# Patient Record
Sex: Male | Born: 1990 | Race: Black or African American | Hispanic: No | Marital: Single | Smoking: Current every day smoker
Health system: Southern US, Community
[De-identification: ages and names within clinical notes are randomized; demographics above are authoritative.]

## PROBLEM LIST (undated history)

## (undated) DIAGNOSIS — K859 Acute pancreatitis without necrosis or infection, unspecified: Secondary | ICD-10-CM

---
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
## 2010-04-28 LAB — COMPREHENSIVE METABOLIC PANEL
ALT: 18 U/L (ref 0–53)
AST: 19 U/L (ref 0–37)
Albumin: 4 g/dL (ref 3.5–5.2)
Alkaline Phosphatase: 97 U/L (ref 39–117)
BUN: 11 mg/dL (ref 6–23)
CO2: 29 mEq/L (ref 19–32)
Calcium: 9.3 mg/dL (ref 8.4–10.5)
Chloride: 107 mEq/L (ref 96–112)
Creatinine, Ser: 1.2 mg/dL (ref 0.4–1.5)
GFR calc Af Amer: >60 mL/min (ref >60)
GFR calc non Af Amer: >60 mL/min (ref >60)
Glucose, Bld: 91 mg/dL (ref 70–99)
Potassium: 3.7 mEq/L (ref 3.5–5.1)
Sodium: 145 mEq/L (ref 135–145)
Total Bilirubin: 0.5 mg/dL (ref 0.3–1.2)
Total Protein: 7.1 g/dL (ref 6.0–8.3)

## 2010-04-28 LAB — CBC
HCT: 34.9 % — ABNORMAL LOW (ref 39.0–52.0)
Hemoglobin: 12 g/dL — ABNORMAL LOW (ref 13.0–17.0)
MCHC: 34.4 g/dL (ref 30.0–36.0)
MCV: 81.9 fL (ref 78.0–100.0)
Platelets: 205 10*3/uL (ref 150–400)
RBC: 4.27 MIL/uL (ref 4.22–5.81)
RDW: 13 % (ref 11.5–15.5)
WBC: 6.8 10*3/uL (ref 4.0–10.5)

## 2010-04-28 LAB — URINALYSIS, ROUTINE W REFLEX MICROSCOPIC
Bilirubin Urine: NEGATIVE
Glucose, UA: NEGATIVE mg/dL
Hgb urine dipstick: NEGATIVE
Ketones, ur: NEGATIVE mg/dL
Nitrite: NEGATIVE
Protein, ur: NEGATIVE mg/dL
Specific Gravity, Urine: 1.031 — ABNORMAL HIGH (ref 1.005–1.030)
Urobilinogen, UA: 1 mg/dL (ref 0.0–1.0)
pH: 6 (ref 5.0–8.0)

## 2010-04-28 LAB — DIFFERENTIAL
Basophils Absolute: 0.1 10*3/uL (ref 0.0–0.1)
Basophils Relative: 2 % — ABNORMAL HIGH (ref 0–1)
Eosinophils Absolute: 0.1 10*3/uL (ref 0.0–0.7)
Monocytes Absolute: 0.4 10*3/uL (ref 0.1–1.0)
Neutro Abs: 4.4 10*3/uL (ref 1.7–7.7)
Neutrophils Relative %: 64 % (ref 43–77)

## 2010-04-28 LAB — AMYLASE: Amylase: 125 U/L — ABNORMAL HIGH (ref 0–105)

## 2010-05-22 LAB — BASIC METABOLIC PANEL
BUN: 10 mg/dL (ref 6–23)
CO2: 27 mEq/L (ref 19–32)
Calcium: 9.9 mg/dL (ref 8.4–10.5)
Creatinine, Ser: 1 mg/dL (ref 0.4–1.5)

## 2010-05-22 LAB — CBC
MCHC: 32.8 g/dL (ref 31.0–37.0)
MCV: 82.9 fL (ref 78.0–98.0)
Platelets: 199 10*3/uL (ref 150–400)
RBC: 4.86 MIL/uL (ref 3.80–5.70)

## 2010-05-22 LAB — DIFFERENTIAL
Basophils Absolute: 0.2 10*3/uL — ABNORMAL HIGH (ref 0.0–0.1)
Basophils Relative: 2 % — ABNORMAL HIGH (ref 0–1)
Eosinophils Absolute: 0.1 10*3/uL (ref 0.0–1.2)
Monocytes Relative: 6 % (ref 3–11)
Neutro Abs: 9.3 10*3/uL — ABNORMAL HIGH (ref 1.7–8.0)
Neutrophils Relative %: 85 % — ABNORMAL HIGH (ref 43–71)

## 2012-01-09 IMAGING — US US ABDOMEN COMPLETE
1 series · 14 of 25 positions shown · non-contrast
Comparison: None.

CLINICAL DATA: Right upper quadrant pain

COMPLETE ABDOMINAL ULTRASOUND

[Series 1: us abdomen complete · 0.26mm/px · 14 of 67 slices shown]
[im 1/67]
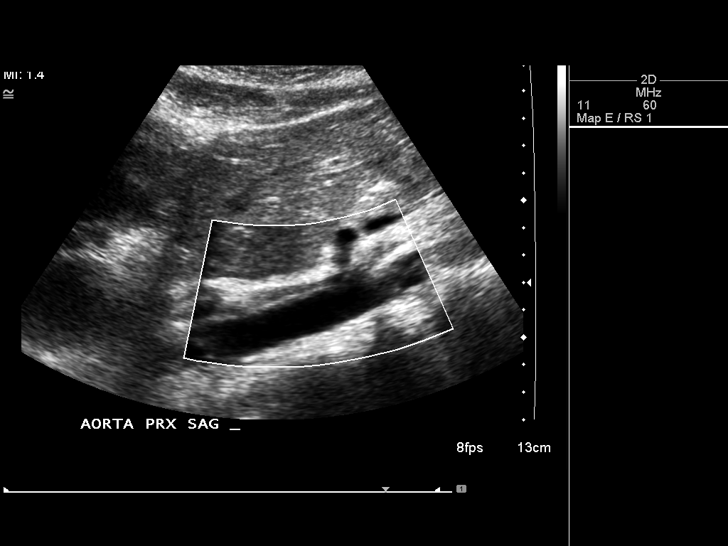
[im 6/67]
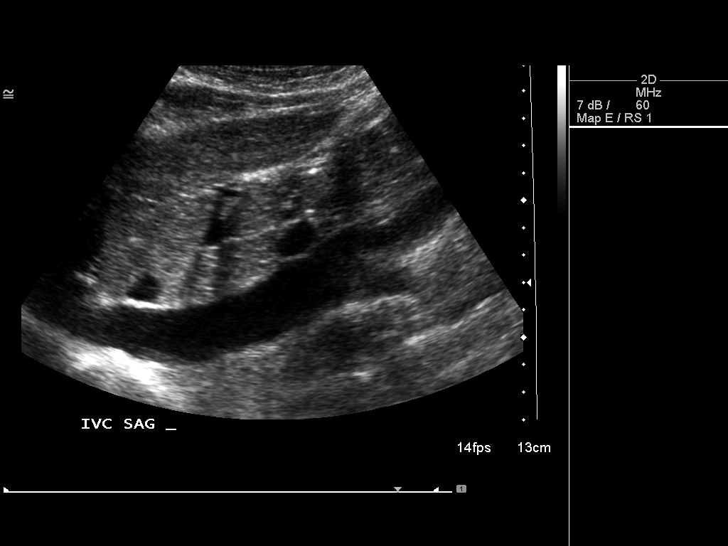
[im 12/67]
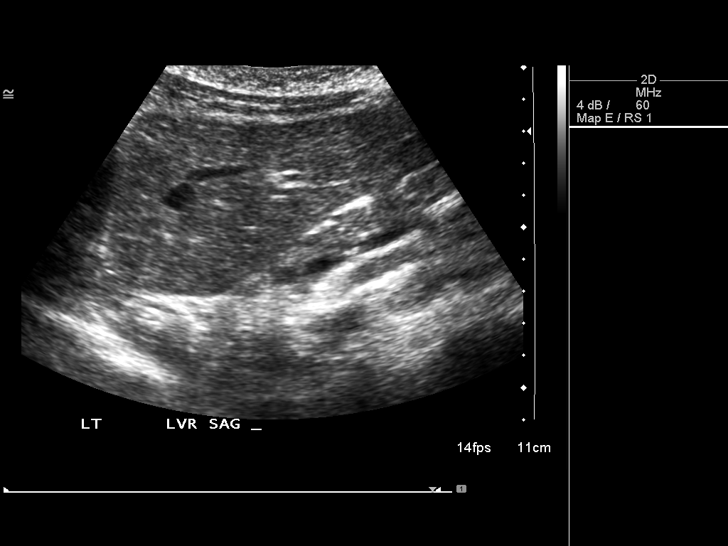
[im 17/67]
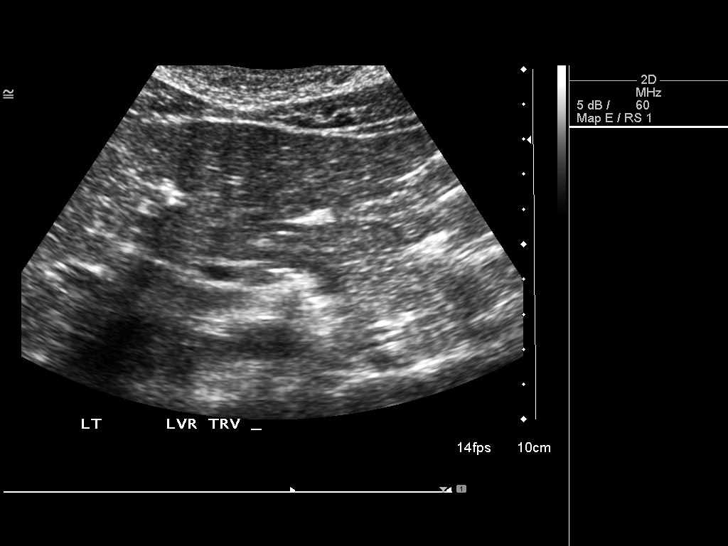
[im 23/67]
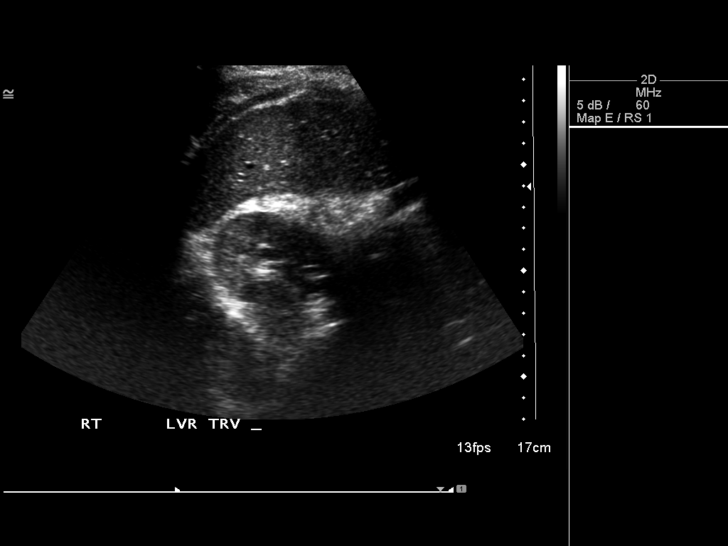
[im 25/67]
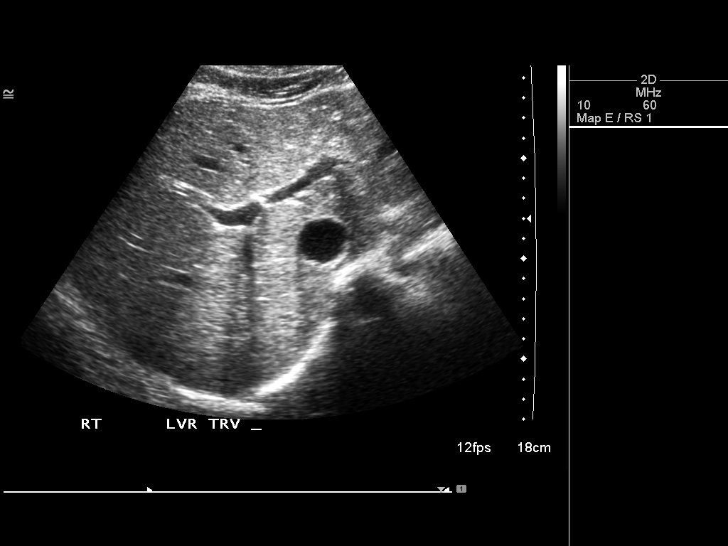
[im 31/67]
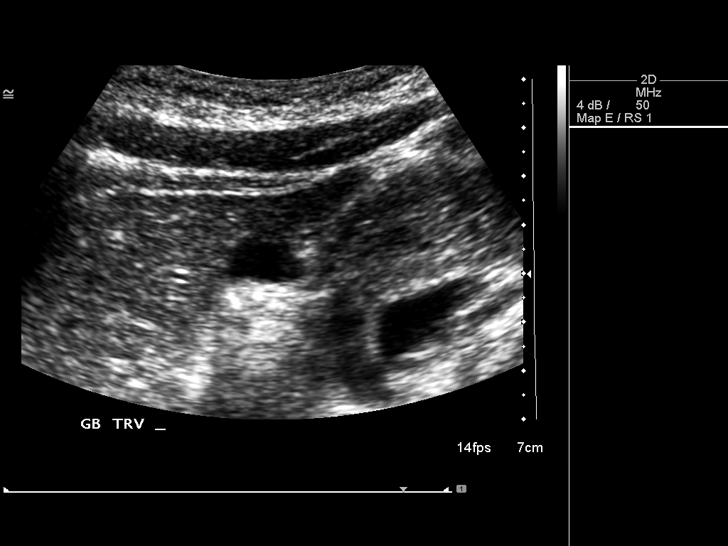
[im 36/67]
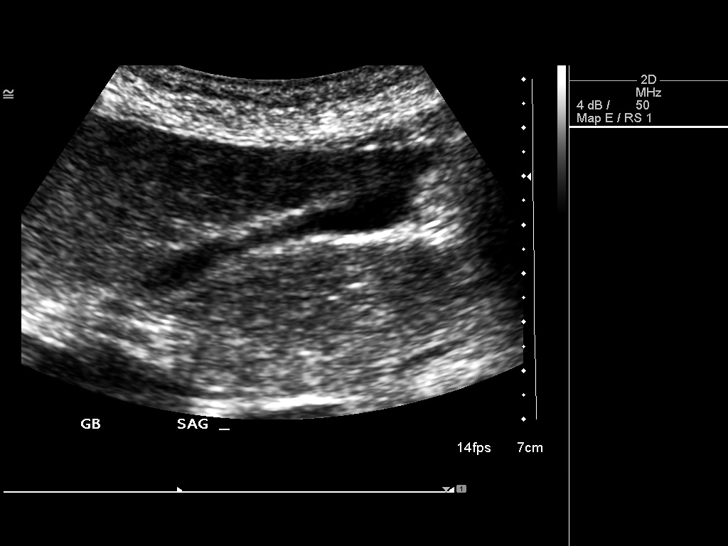
[im 42/67]
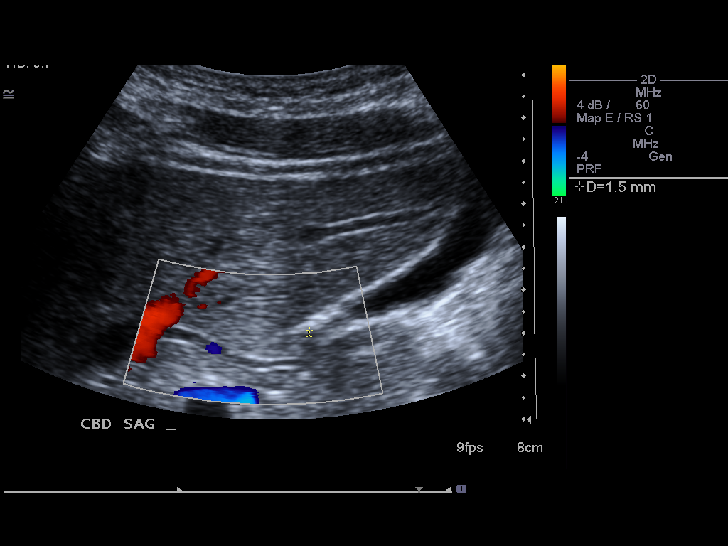
[im 45/67]
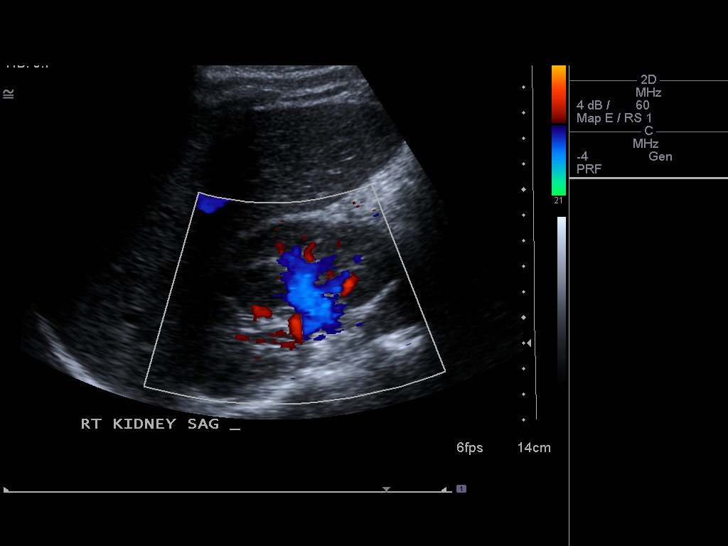
[im 50/67]
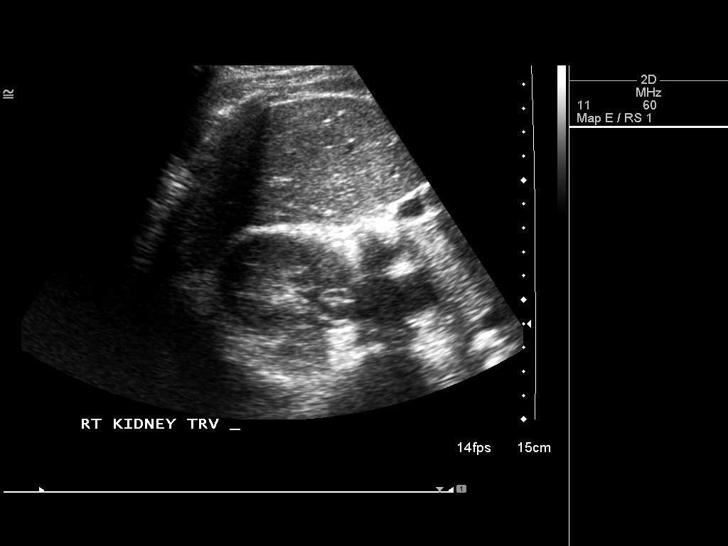
[im 56/67]
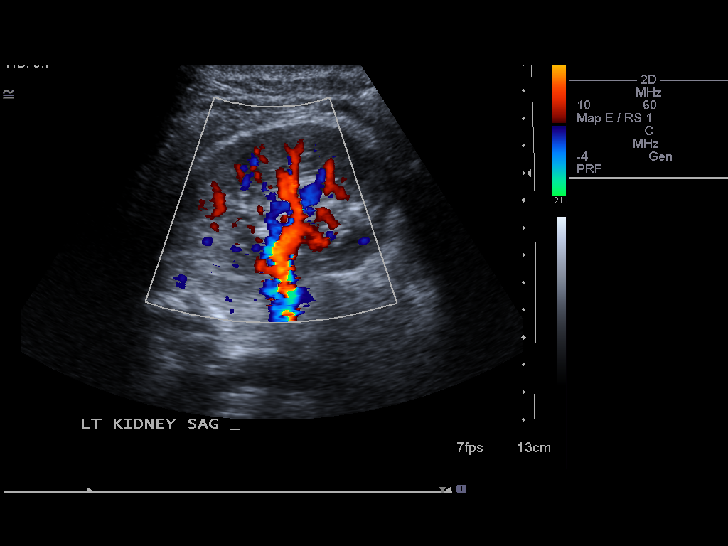
[im 61/67]
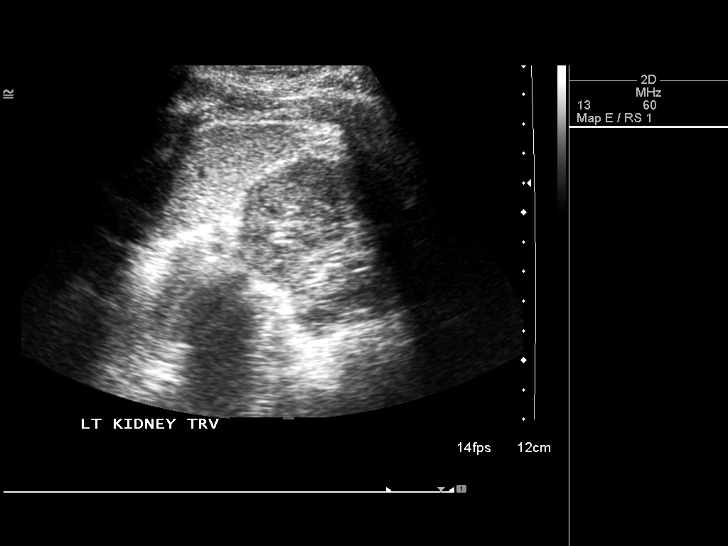
[im 67/67]
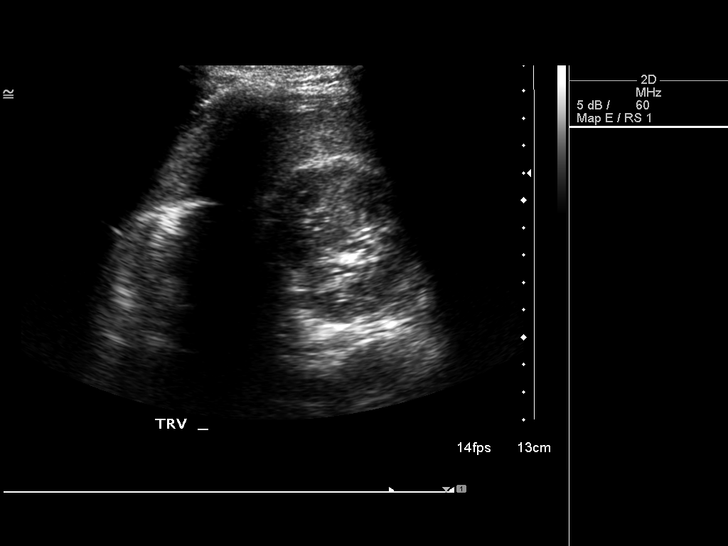

[14 of 25 positions shown; findings below may reference images not displayed]

FINDINGS: Gallbladder:  The gallbladder is contracted.  The patient
apparently ate a meal prior to the ultrasound exam.  There are no
definite stones.  No wall thickening or edema.  However,
considering the symptoms, the patient should be restudied after an
overnight fast.  The follow-up exam can be limited to the
gallbladder.

Common bile duct:  1.5 mm

Liver:  Normal

IVC:  Normal

Pancreas:  Normal

Spleen:  Normal

Right Kidney:  Normal.  11 cm in length.

Left Kidney:  Normal.  10.1 cm in length.

Abdominal aorta:  Normal
IMPRESSION: 1.  The gallbladder is contracted, probably related to oral intake
by the patient prior to the study.  Ideally, ideally, a limited
study of the gallbladder should be done after an overnight fast to
clear the gallbladder.
2.  No other acute or significant findings.

## 2012-04-03 ENCOUNTER — Encounter (HOSPITAL_BASED_OUTPATIENT_CLINIC_OR_DEPARTMENT_OTHER): Payer: Self-pay | Admitting: *Deleted

## 2012-04-03 ENCOUNTER — Emergency Department (HOSPITAL_BASED_OUTPATIENT_CLINIC_OR_DEPARTMENT_OTHER)
Admission: EM | Admit: 2012-04-03 | Discharge: 2012-04-03 | Disposition: A | Discharge status: Home / Self Care | Payer: BC Managed Care – PPO | Attending: Emergency Medicine | Admitting: Emergency Medicine

## 2012-04-03 ENCOUNTER — Emergency Department (HOSPITAL_BASED_OUTPATIENT_CLINIC_OR_DEPARTMENT_OTHER): Payer: BC Managed Care – PPO

## 2012-04-03 DIAGNOSIS — N503 Cyst of epididymis: Secondary | ICD-10-CM

## 2012-04-03 DIAGNOSIS — N5089 Other specified disorders of the male genital organs: Secondary | ICD-10-CM | POA: Insufficient documentation

## 2012-04-03 DIAGNOSIS — N453 Epididymo-orchitis: Secondary | ICD-10-CM | POA: Insufficient documentation

## 2012-04-03 MED ORDER — OXYCODONE-ACETAMINOPHEN 5-325 MG PO TABS
2.0000 | ORAL_TABLET | Freq: Once | ORAL | Status: AC
  Administered 2012-04-03: 2 via ORAL
  Filled 2012-04-03 (×2): qty 2

## 2012-04-03 MED ORDER — OXYCODONE-ACETAMINOPHEN 5-325 MG PO TABS: 2.0000 | ORAL_TABLET | ORAL | Status: DC | PRN

## 2012-04-03 MED ORDER — IBUPROFEN 800 MG PO TABS: 800.0000 mg | ORAL_TABLET | Freq: Three times a day (TID) | ORAL | Status: DC

## 2012-04-03 NOTE — ED Notes (Addendum)
Pt states he has had a boil? In his "private area" x 1 year. "Way worse now" Saw Dr. on Thursday and was placed on antibiotic.

## 2012-04-03 NOTE — ED Provider Notes (Signed)
History     CSN: [id number]  Arrival date & time 04/03/12  1748   First MD Initiated Contact with Patient 04/03/12 1815      Chief Complaint  Patient presents with  . Abscess    (Consider location/radiation/quality/duration/timing/severity/associated sxs/prior treatment) HPI Comments: Patient is a 22 year old male who presents with a 1 year history of scrotal swelling that became acutely worse 5 days ago. Patient reports gradual onset and progressive worsening of symptoms. Patient reports an area of tenderness associated with the swelling. Palpation makes the pain worse. Nothing makes the pain better. Patient saw his PCP 3 days ago who prescribed Augmentin. Patient reports increased swelling and pain since the Augmentin was started. No fever, abdominal pain, NVD, or other associated symptoms.    History reviewed. No pertinent past medical history.  History reviewed. No pertinent past surgical history.  History reviewed. No pertinent family history.  History  Substance Use Topics  . Smoking status: Never Smoker   . Smokeless tobacco: Not on file  . Alcohol Use: No      Review of Systems  Genitourinary: Positive for scrotal swelling.  All other systems reviewed and are negative.    Allergies  Review of patient's allergies indicates no known allergies.  Home Medications  No current outpatient prescriptions on file.  BP 134/75  Pulse 94  Temp(Src) 98.3 F (36.8 C) (Oral)  Resp 15  Ht 6\' 1"  (1.854 m)  Wt 190 lb (86.183 kg)  BMI 25.07 kg/m2  SpO2 100%  Physical Exam  Nursing note and vitals reviewed. Constitutional: He is oriented to person, place, and time. He appears well-developed and well-nourished. No distress.  HENT:  Head: Normocephalic and atraumatic.  Eyes: Conjunctivae and EOM are normal.  Neck: Normal range of motion.  Cardiovascular: Normal rate and regular rhythm.  Exam reveals no gallop and no friction rub.   No murmur heard. Pulmonary/Chest:  Effort normal and breath sounds normal. He has no wheezes. He has no rales. He exhibits no tenderness.  Abdominal: Soft. He exhibits no distension. There is no tenderness. There is no rebound.  Genitourinary: Penis normal.  Posterior scrotal tenderness to palpation with a central area of tenderness with noted erythema and edema. Palpable mass extending from the central scrotum up between testicles.   Musculoskeletal: Normal range of motion.  Neurological: He is alert and oriented to person, place, and time. Coordination normal.  Speech is goal-oriented. Moves limbs without ataxia.   Skin: Skin is warm and dry.  Psychiatric: He has a normal mood and affect. His behavior is normal.    ED Course  Procedures (including critical care time)  Labs Reviewed - No data to display US Scrotum  04/03/2012  *RADIOLOGY REPORT*  Clinical Data:  Pain and swelling.  Infection.  SCROTAL ULTRASOUND DOPPLER ULTRASOUND OF THE TESTICLES  Technique: Complete ultrasound examination of the testicles, epididymis, and other scrotal structures was performed.  Color and spectral Doppler ultrasound were also utilized to evaluate blood flow to the testicles.  Comparison:  None  Findings:  Right testis:  Normal measuring 5.2 x 2.9 x 4.9 cm.  Normal echogenicity.  No focal lesions.  Normal color flow pattern. Normal arterial and venous wave forms.  Left testis:  Normal measuring 5.3 cm x 2.5 cm x 4.0 cm.  Similarly normal Doppler wave forms and color flow pattern.  Right epididymis:  5 mm epididymal cyst.  Borderline increased color flow pattern.  Left epididymis:  3 mm cyst.  Borderline  increased color flow pattern.  Hydrocele:  Small hydrocele on the left.  Varicocele:  None  Pulsed Doppler interrogation of both testes demonstrates low resistance flow bilaterally.  At the area of pain, there is a 2.7 x 2.2 x 2.5 cm area of heterogeneous echogenicity with increased vascularity that looks like an area of phlegmonous inflammation or  early abscess.  IMPRESSION: Normal appearance of the testicles themselves.  Question slight hyperemia of the epididymi which could go along with low-level epididymitis.   Small hydrocele on the left.  In the area of concern of the scrotal pain, there is a heterogeneous 2.7 cm collection consistent with an area of phlegmonous inflammation or early abscess formation.   Original Report Authenticated By: Paulina Fusi, M.D.      1. Epididymal cyst   2. Epididymitis       MDM  6:49 PM Patient will have US scrotum. Patient declines pain medication. Patient afebrile with stable vitals.   8:16 PM US shows right and left epididymal cysts with another area of inflammation likely infectious and possible early abscess. Patient will have Percocet for pain and Urology follow up. Patient instructed to continue to take Augmentin until gone. Patient afebrile and non toxic appearing. Vitals stable for discharge.     Emilia Beck, [place] 04/03/12 2039

## 2012-04-03 NOTE — ED Notes (Signed)
ED PAC at bedside. 

## 2012-04-03 NOTE — ED Provider Notes (Signed)
Medical screening examination/treatment/procedure(s) were conducted as a shared visit with non-physician practitioner(s) and myself.  I personally evaluated the patient during the encounter  Derwood Kaplan, MD 04/03/12 2353

## 2012-05-23 ENCOUNTER — Encounter (HOSPITAL_COMMUNITY): Payer: Self-pay | Admitting: *Deleted

## 2012-05-23 ENCOUNTER — Emergency Department (HOSPITAL_COMMUNITY)
Admission: EM | Admit: 2012-05-23 | Discharge: 2012-05-23 | Disposition: A | Discharge status: Home / Self Care | Payer: BC Managed Care – PPO | Attending: Emergency Medicine | Admitting: Emergency Medicine

## 2012-05-23 DIAGNOSIS — K649 Unspecified hemorrhoids: Secondary | ICD-10-CM

## 2012-05-23 DIAGNOSIS — N390 Urinary tract infection, site not specified: Secondary | ICD-10-CM | POA: Insufficient documentation

## 2012-05-23 DIAGNOSIS — K644 Residual hemorrhoidal skin tags: Secondary | ICD-10-CM | POA: Insufficient documentation

## 2012-05-23 LAB — URINALYSIS, ROUTINE W REFLEX MICROSCOPIC
Bilirubin Urine: NEGATIVE
Ketones, ur: NEGATIVE mg/dL
Leukocytes, UA: NEGATIVE
Nitrite: NEGATIVE
Protein, ur: NEGATIVE mg/dL
Urobilinogen, UA: 1 mg/dL (ref 0.0–1.0)

## 2012-05-23 MED ORDER — HYDROCORTISONE 2.5 % RE CREA: TOPICAL_CREAM | Freq: Two times a day (BID) | RECTAL | Status: DC

## 2012-05-23 NOTE — ED Provider Notes (Signed)
History     CSN: [id number]  Arrival date & time 05/23/12  1029   First MD Initiated Contact with Patient 05/23/12 1037      Chief Complaint  Patient presents with  . Abscess    anal  . Urinary Tract Infection    (Consider location/radiation/quality/duration/timing/severity/associated sxs/prior treatment) HPI Comments: Pt states that he has a bump in his bottom and he feels pressure:pt states that he has burning with urination:pt states that he is sexually active and doesn't use condom and pt denies rectal sex:pt states that he has had more that one sexual contact  Patient is a 22 y.o. male presenting with abscess. The history is provided by the patient. No language interpreter was used.  Abscess Location:  Ano-genital Ano-genital abscess location:  Anus Abscess quality: painful   Abscess quality: not draining and no warmth   Duration:  3 days Progression:  Unchanged Pain details:    Quality:  Throbbing Context: not diabetes, not immunosuppression and not injected drug use   Relieved by:  Nothing Worsened by:  Nothing tried Ineffective treatments:  None tried Risk factors: prior abscess     History reviewed. No pertinent past medical history.  History reviewed. No pertinent past surgical history.  No family history on file.  History  Substance Use Topics  . Smoking status: Never Smoker   . Smokeless tobacco: Never Used  . Alcohol Use: No      Review of Systems  Constitutional: Negative.   Respiratory: Negative.   Cardiovascular: Negative.     Allergies  Fruit & vegetable daily  Home Medications   Current Outpatient Rx  Name  Route  Sig  Dispense  Refill  . amoxicillin-clavulanate (AUGMENTIN) 875-125 MG per tablet   Oral   Take 1 tablet by mouth 2 (two) times daily.           BP 158/72  Pulse 103  Temp(Src) 97.9 F (36.6 C) (Oral)  Resp 20  SpO2 100%  Physical Exam  Nursing note and vitals reviewed. Constitutional: He is oriented to  person, place, and time. He appears well-developed and well-nourished.  Cardiovascular: Normal rate and regular rhythm.   Pulmonary/Chest: Effort normal and breath sounds normal.  Abdominal: Soft. Bowel sounds are normal. There is no tenderness.  Genitourinary:  No penile discharge noted:pt has external hemorrhoid noted:no warmth or redness:no internal fluctuance or tenderness noted   Musculoskeletal: Normal range of motion.  Neurological: He is oriented to person, place, and time.  Skin: Skin is warm and dry.    ED Course  Procedures (including critical care time)  Labs Reviewed  GC/CHLAMYDIA PROBE AMP  URINALYSIS, ROUTINE W REFLEX MICROSCOPIC   No results found.   1. Hemorrhoid       MDM  No infection noted in urine:rectal exam consistent with hemorrhoid not abcess:will treat:pt educated on using condoms       Teressa Lower, NP 05/23/12 1201

## 2012-05-23 NOTE — ED Notes (Signed)
Pt states a few days ago developed a anal abscess, also having L flank pain and burning w/ urination. Pt denies n/v/d.

## 2012-05-24 LAB — GC/CHLAMYDIA PROBE AMP
CT Probe RNA: NEGATIVE
GC Probe RNA: NEGATIVE

## 2012-05-24 NOTE — ED Provider Notes (Signed)
Medical screening examination/treatment/procedure(s) were performed by non-physician practitioner and as supervising physician I was immediately available for consultation/collaboration.  Nashawn Hillock T Elouise Divelbiss, MD 05/24/12 0807 

## 2014-09-11 IMAGING — US US SCROTUM
1 series · 13 of 25 positions shown · non-contrast
Comparison: None

CLINICAL DATA: Pain and swelling.  Infection.

SCROTAL ULTRASOUND
DOPPLER ULTRASOUND OF THE TESTICLES
TECHNIQUE: Complete ultrasound examination of the testicles,
epididymis, and other scrotal structures was performed.  Color and
spectral Doppler ultrasound were also utilized to evaluate blood
flow to the testicles.

[Series 1: us scrotum · 0.08mm/px · 13 of 29 slices shown]
[im 1/29]
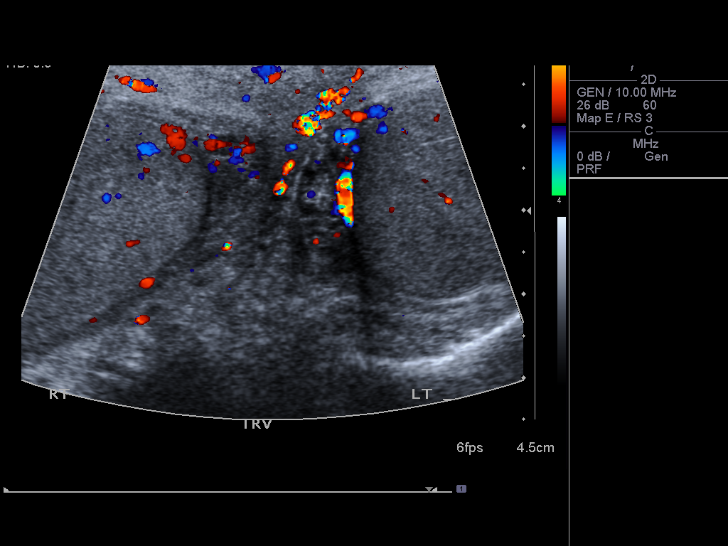
[im 3/29]
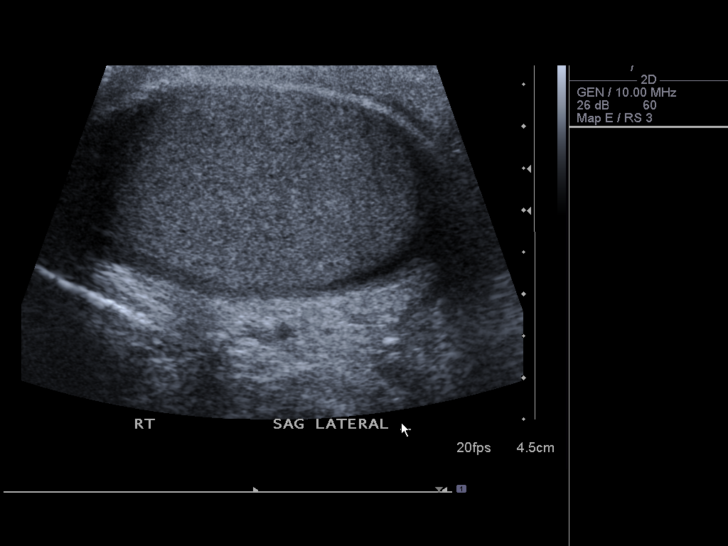
[im 5/29]
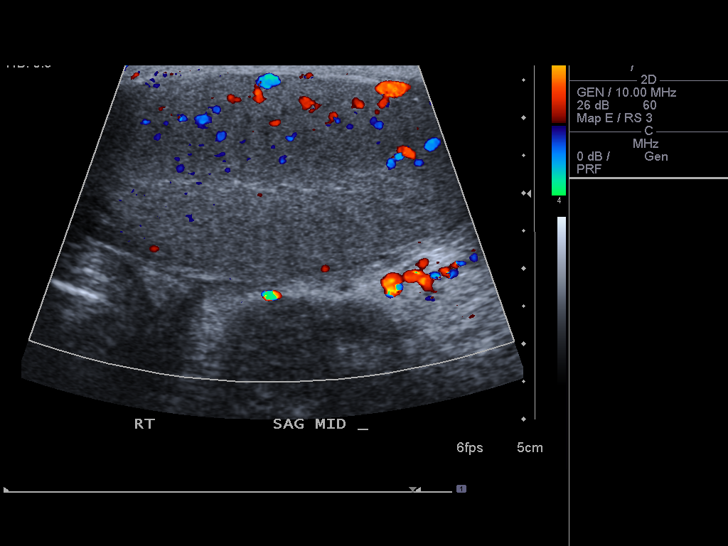
[im 8/29]
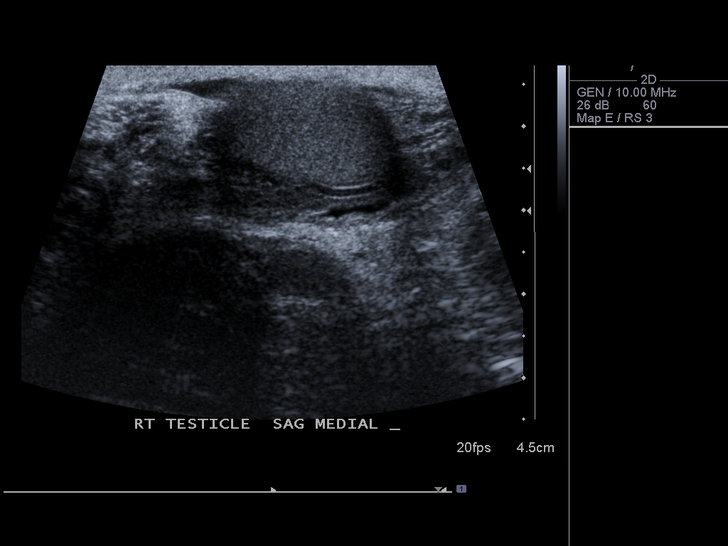
[im 10/29]
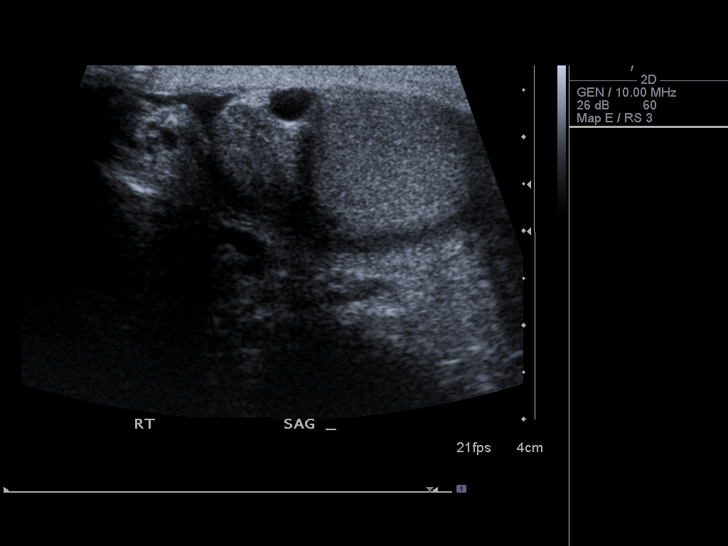
[im 12/29]
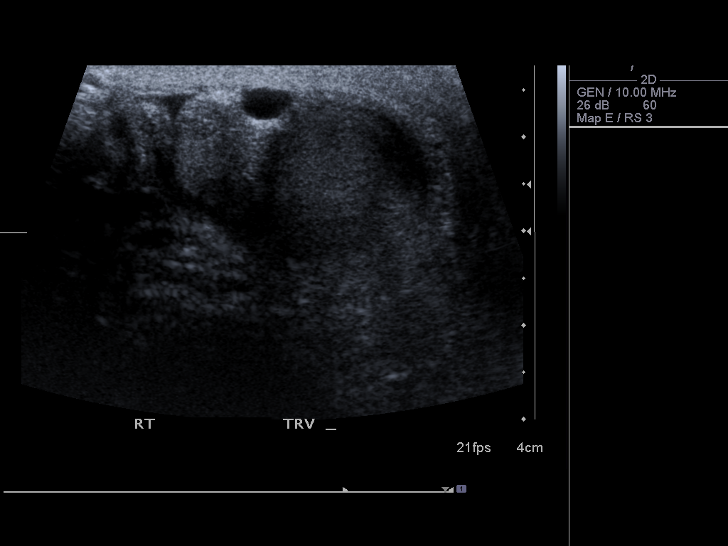
[im 15/29]
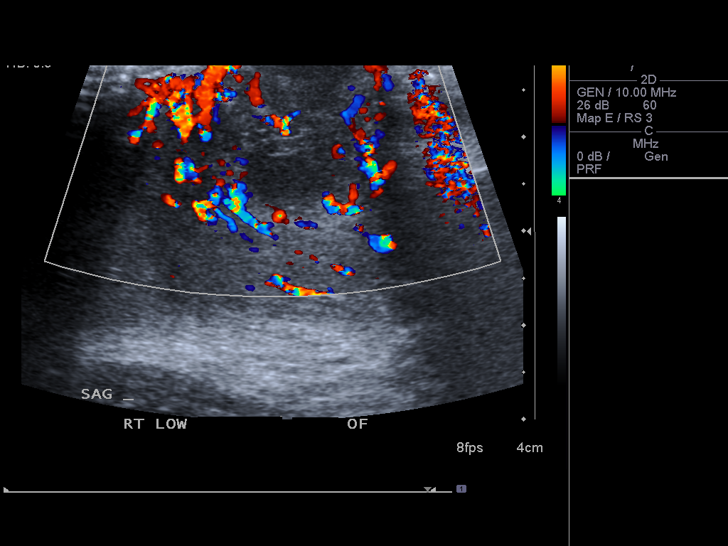
[im 17/29]
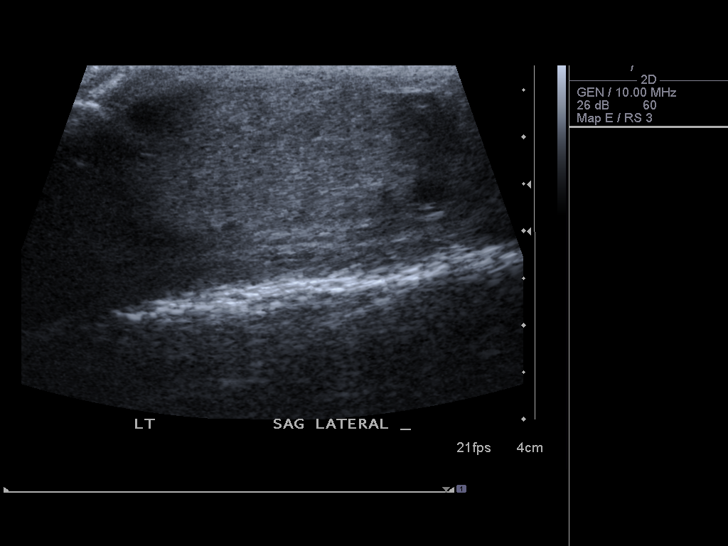
[im 19/29]
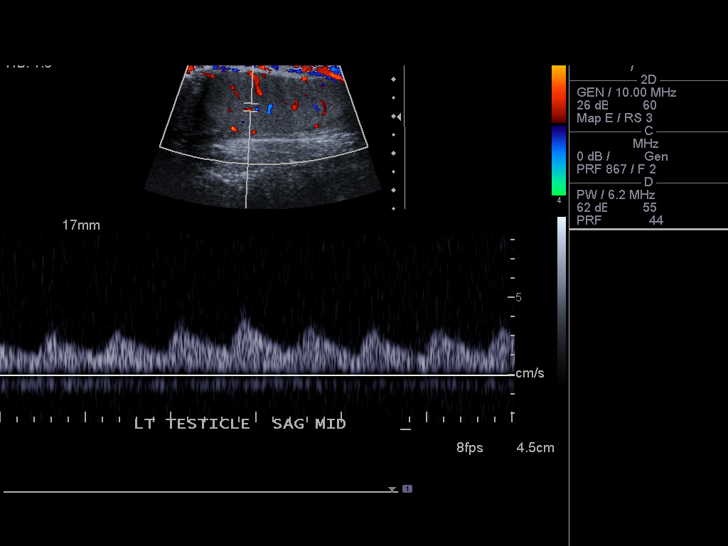
[im 22/29]
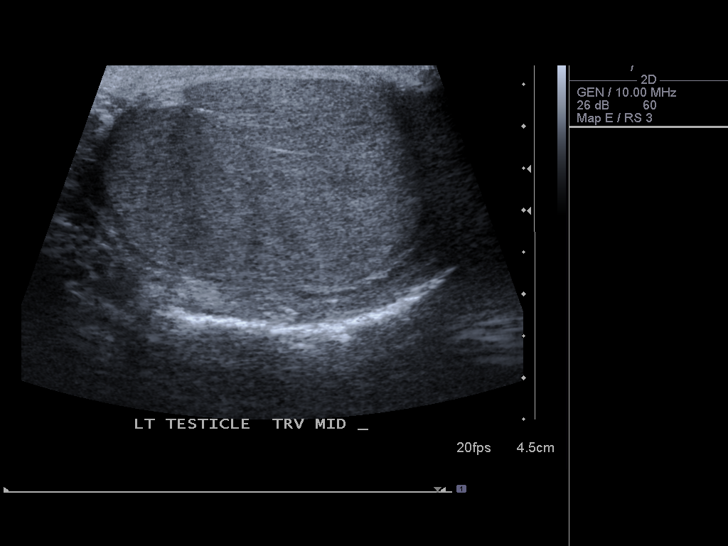
[im 24/29]
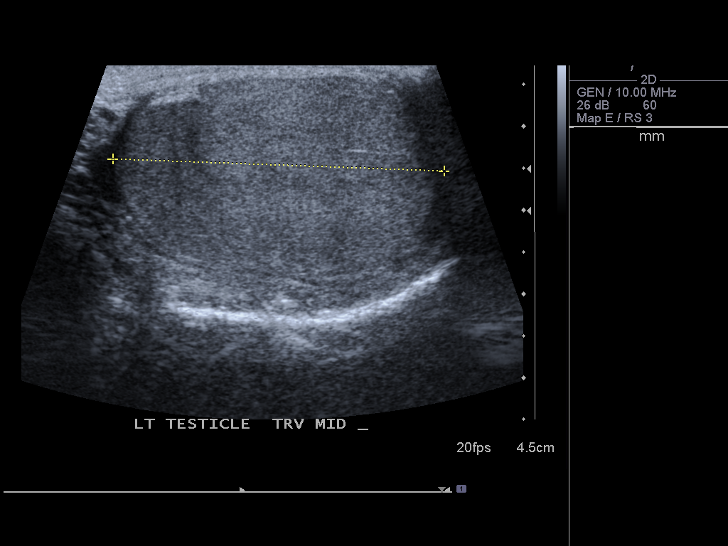
[im 26/29]
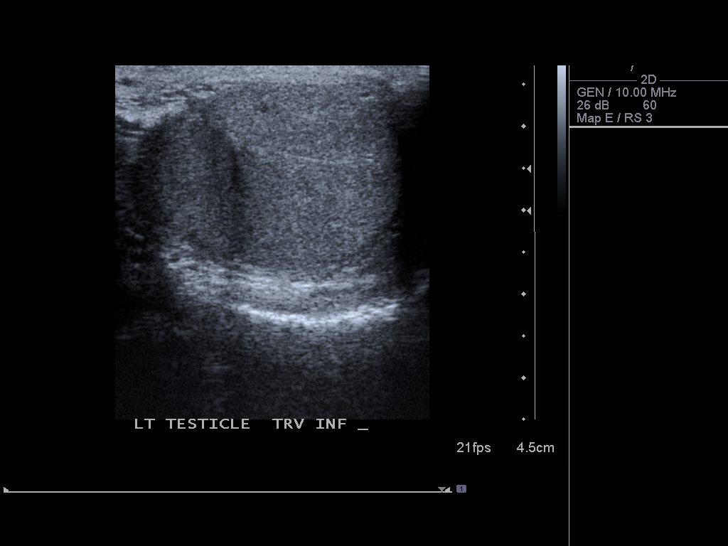
[im 29/29]
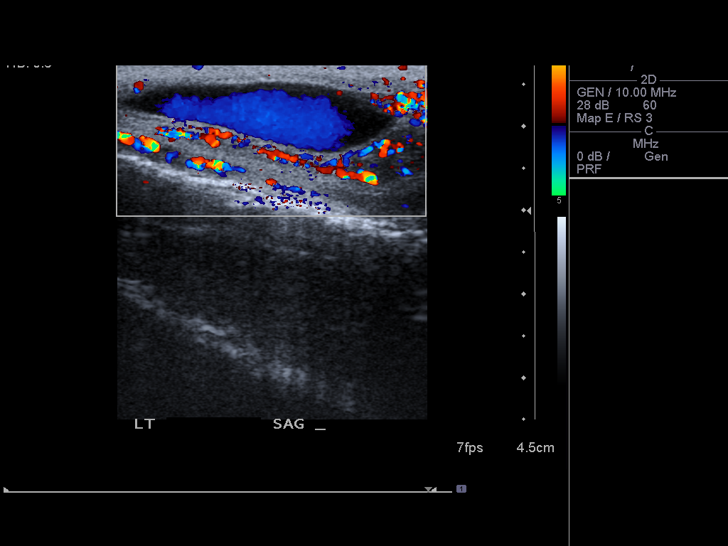

[13 of 25 positions shown; findings below may reference images not displayed]

FINDINGS: Right testis:  Normal measuring 5.2 x 2.9 x 4.9 cm.  Normal
echogenicity.  No focal lesions.  Normal color flow pattern.
Normal arterial and venous wave forms.

Left testis:  Normal measuring 5.3 cm x 2.5 cm x 4.0 cm.  Similarly
normal Doppler wave forms and color flow pattern.

Right epididymis:  5 mm epididymal cyst.  Borderline increased
color flow pattern.

Left epididymis:  3 mm cyst.  Borderline increased color flow
pattern.

Hydrocele:  Small hydrocele on the left.

Varicocele:  None

Pulsed Doppler interrogation of both testes demonstrates low
resistance flow bilaterally.

At the area of pain, there is a 2.7 x 2.2 x 2.5 cm area of
heterogeneous echogenicity with increased vascularity that looks
like an area of phlegmonous inflammation or early abscess.
IMPRESSION: Normal appearance of the testicles themselves.  Question slight
hyperemia of the epididymi which could go along with low-level
epididymitis.   Small hydrocele on the left.

In the area of concern of the scrotal pain, there is a
heterogeneous 2.7 cm collection consistent with an area of
phlegmonous inflammation or early abscess formation.

## 2014-10-18 ENCOUNTER — Encounter (HOSPITAL_COMMUNITY): Payer: Self-pay | Admitting: Emergency Medicine

## 2014-10-18 ENCOUNTER — Emergency Department (HOSPITAL_COMMUNITY)
Admission: EM | Admit: 2014-10-18 | Discharge: 2014-10-18 | Discharge status: Against Medical Advice | Payer: Self-pay | Attending: Emergency Medicine | Admitting: Emergency Medicine

## 2014-10-18 ENCOUNTER — Emergency Department (HOSPITAL_COMMUNITY)
Admission: EM | Admit: 2014-10-18 | Discharge: 2014-10-18 | Disposition: A | Discharge status: Home / Self Care | Payer: Self-pay

## 2014-10-18 DIAGNOSIS — B353 Tinea pedis: Secondary | ICD-10-CM | POA: Insufficient documentation

## 2014-10-18 DIAGNOSIS — B356 Tinea cruris: Secondary | ICD-10-CM | POA: Insufficient documentation

## 2014-10-18 DIAGNOSIS — R42 Dizziness and giddiness: Secondary | ICD-10-CM | POA: Insufficient documentation

## 2014-10-18 DIAGNOSIS — R0602 Shortness of breath: Secondary | ICD-10-CM | POA: Insufficient documentation

## 2014-10-18 NOTE — ED Notes (Signed)
No answer in WR x1

## 2014-10-18 NOTE — ED Notes (Signed)
Pt states he feels [employer], dizzy and feel like his heart is fluttering onset Saturday.  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

## 2014-10-18 NOTE — ED Notes (Signed)
Pt called no answer 

## 2014-10-18 NOTE — ED Notes (Signed)
Patient called x2. No answer.  

## 2015-09-12 ENCOUNTER — Encounter (HOSPITAL_BASED_OUTPATIENT_CLINIC_OR_DEPARTMENT_OTHER): Payer: Self-pay | Admitting: *Deleted

## 2015-09-12 ENCOUNTER — Emergency Department (HOSPITAL_BASED_OUTPATIENT_CLINIC_OR_DEPARTMENT_OTHER)
Admission: EM | Admit: 2015-09-12 | Discharge: 2015-09-12 | Disposition: A | Discharge status: Home / Self Care | Payer: Self-pay | Attending: Emergency Medicine | Admitting: Emergency Medicine

## 2015-09-12 DIAGNOSIS — F17210 Nicotine dependence, cigarettes, uncomplicated: Secondary | ICD-10-CM | POA: Insufficient documentation

## 2015-09-12 DIAGNOSIS — R1013 Epigastric pain: Secondary | ICD-10-CM | POA: Insufficient documentation

## 2015-09-12 HISTORY — DX: Acute pancreatitis without necrosis or infection, unspecified: K85.90

## 2015-09-12 MED ORDER — FAMOTIDINE 20 MG PO TABS: 20.0000 mg | ORAL_TABLET | Freq: Every day | ORAL | 0 refills | Status: AC

## 2015-09-12 NOTE — ED Triage Notes (Signed)
Pt c/o epigastric abd pain  X 3 weeks HX pancreatitis

## 2015-09-12 NOTE — Discharge Instructions (Signed)

## 2015-09-12 NOTE — ED Notes (Signed)
Pt verbalizes understanding of d/c instructions and denies any further needs at this time. 

## 2015-09-12 NOTE — ED Provider Notes (Signed)
[facility] Provider Note   CSN: [id number] Arrival date & time: 09/12/15  1851  First Provider Contact:  First MD Initiated Contact with Patient 09/12/15 1956     By signing my name below, I, Freida Busman, attest that this documentation has been prepared under the direction and in the presence of Zadie Rhine, MD . Electronically Signed: Freida Busman, Scribe. 09/12/2015. 8:10 PM.  History   Chief Complaint Chief Complaint  Patient presents with  . Abdominal Pain    The history is provided by the patient. No language interpreter was used.  Abdominal Pain   This is a new problem. The current episode started more than 1 week ago. The pain is located in the epigastric region. The pain is moderate. Pertinent negatives include fever, hematochezia, nausea, vomiting, dysuria and headaches. Nothing aggravates the symptoms. Nothing relieves the symptoms. His past medical history does not include ulcerative colitis, Crohn's disease or irritable bowel syndrome.   HPI Comments:  Brian Oneal is a 25 y.o. male who presents to the Emergency Department complaining of sharp, intermittent, epigastric pain x 3-4 weeks; denies pain at this time.  Pt reports h/o similar pain when diagnosed with pancreatitis in 2011. Pt last drank alcohol last night; states he has been drinking daily for the last few weeks. He denies exacerbation of symptoms when drinking. He denies fever, nausea, vomiting, HA, lower abdominal pain, back pain, dysuria, melena, and hematochezia. He also denies excessive use of advil and motrin. No alleviating factors noted. No CP reported No pleuritic pain reported  Past Medical History:  Diagnosis Date  . Pancreatitis, acute     There are no active problems to display for this patient.   History reviewed. No pertinent surgical history.   Home Medications    Prior to Admission medications   Medication Sig Start Date End Date Taking? Authorizing Provider  famotidine  (PEPCID) 20 MG tablet Take 1 tablet (20 mg total) by mouth daily. 09/12/15   Zadie Rhine, MD    Family History History reviewed. No pertinent family history.  Social History Social History  Substance Use Topics  . Smoking status: Current Every Day Smoker    Types: Cigarettes, Cigars  . Smokeless tobacco: Never Used  . Alcohol use Yes     Allergies   Fruit & vegetable daily [nutritional supplements]   Review of Systems Review of Systems  Constitutional: Negative for fever.  Cardiovascular: Negative for chest pain.  Gastrointestinal: Positive for abdominal pain. Negative for blood in stool, hematochezia, nausea and vomiting.  Genitourinary: Negative for dysuria.  Musculoskeletal: Negative for back pain.  Neurological: Negative for headaches.  All other systems reviewed and are negative.  Physical Exam Updated Vital Signs BP 144/85 (BP Location: Right Arm)   Pulse 78   Temp 99.1 F (37.3 C) (Oral)   Ht  (1.905 m)   Wt 220 lb (99.8 kg)   SpO2 100%   BMI 27.50 kg/m   Physical Exam CONSTITUTIONAL: Well developed/well nourished HEAD: Normocephalic/atraumatic EYES: EOMI/PERRL; no icterus  ENMT: Mucous membranes moist NECK: supple no meningeal signs SPINE/BACK:entire spine nontender CV: S1/S2 noted, no murmurs/rubs/gallops noted LUNGS: Lungs are clear to auscultation bilaterally, no apparent distress ABDOMEN: soft, nontender, no rebound or guarding, bowel sounds noted throughout abdomen GU:no cva tenderness NEURO: Pt is awake/alert/appropriate, moves all extremitiesx4.  No facial droop.   EXTREMITIES: pulses normal/equal, full ROM SKIN: warm, color normal PSYCH: no abnormalities of mood noted, alert and oriented to situation  ED  Treatments / Results  DIAGNOSTIC STUDIES:  Oxygen Saturation is 100% on RA, normal by my interpretation.    COORDINATION OF CARE:  8:01 PM Discussed treatment plan with pt at bedside and pt agreed to plan.  Labs (all labs  ordered are listed, but only abnormal results are displayed) Labs Reviewed - No data to display  EKG  EKG Interpretation None       Radiology No results found.  Procedures Procedures   Medications Ordered in ED Medications - No data to display   Initial Impression / Assessment and Plan / ED Course  I have reviewed the triage vital signs and the nursing notes.  Pertinent labs & imaging results that were available during my care of the patient were reviewed by me and considered in my medical decision making (see chart for details).  Clinical Course    Pt well appearing No focal pain at this time He reports similar to prior episodes of pancreatitis Defer workup at this time as he is well appearing and pain free Advised to cut back on ETOH as this is likely cause of pain Will prescribe pepcid We discussed strict return precautions  Final Clinical Impressions(s) / ED Diagnoses   Final diagnoses:  Epigastric abdominal pain    New Prescriptions New Prescriptions   FAMOTIDINE (PEPCID) 20 MG TABLET    Take 1 tablet (20 mg total) by mouth daily.   I personally performed the services described in this documentation, which was scribed in my presence. The recorded information has been reviewed and is accurate.        Zadie Rhine, MD 09/12/15 2033
